# Patient Record
Sex: Male | Born: 1980 | Race: White | Hispanic: No | Marital: Married | Smoking: Former smoker
Health system: Southern US, Community
[De-identification: ages and names within clinical notes are randomized; demographics above are authoritative.]

## PROBLEM LIST (undated history)

## (undated) DIAGNOSIS — T7840XA Allergy, unspecified, initial encounter: Secondary | ICD-10-CM

## (undated) HISTORY — DX: Allergy, unspecified, initial encounter: T78.40XA

---

## 2012-02-05 ENCOUNTER — Ambulatory Visit: Payer: Self-pay | Admitting: Family Medicine

## 2012-02-05 VITALS — BP 108/80 | HR 108 | Temp 98.8°F | Resp 20 | Ht 69.0 in | Wt 188.2 lb

## 2012-02-05 DIAGNOSIS — R6883 Chills (without fever): Secondary | ICD-10-CM

## 2012-02-05 DIAGNOSIS — R509 Fever, unspecified: Secondary | ICD-10-CM

## 2012-02-05 DIAGNOSIS — J111 Influenza due to unidentified influenza virus with other respiratory manifestations: Secondary | ICD-10-CM

## 2012-02-05 DIAGNOSIS — R52 Pain, unspecified: Secondary | ICD-10-CM

## 2012-02-05 DIAGNOSIS — J101 Influenza due to other identified influenza virus with other respiratory manifestations: Secondary | ICD-10-CM

## 2012-02-05 LAB — POCT INFLUENZA A/B: Influenza B, POC: NEGATIVE

## 2012-02-05 NOTE — Progress Notes (Signed)
[address]   [place], [place]  [address]   [phone]  Subjective:    Patient ID: Alexis Hayes, male    DOB: 28-Sep-1980, 31 y.o.   MRN: [id number]  HPIThis 31 y.o. male presents for evaluation of fever, cough.  "I think I have the flu".  Onset two days ago.  +fever Tmax 102.  +myalgias.  +HA.  Mild ear pain L.  +scratchy throat; no pain with swallowing.  No rhinorrhea; mild nasal congestion.  +cough mild; +sputum yellowish tent.  No nausea, vomiting.  +diarrhea x 1.  No SOB.  No rash.  No flu vaccine this season.  Mucinex, Advil, Nyquil.    PCP:  none   Review of Systems  Constitutional: Positive for fever, chills and fatigue. Negative for diaphoresis.  HENT: Positive for ear pain and congestion. Negative for sore throat, rhinorrhea, trouble swallowing, neck pain, neck stiffness and voice change.   Respiratory: Positive for cough. Negative for shortness of breath, wheezing and stridor.   Gastrointestinal: Positive for diarrhea. Negative for nausea and vomiting.  Skin: Negative for rash.  Neurological: Positive for headaches.        Past Medical History  Diagnosis Date  . Allergy     History reviewed. No pertinent past surgical history.  Prior to Admission medications   Medication Sig Start Date End Date Taking? Authorizing Provider  guaiFENesin (MUCINEX) 600 MG 12 hr tablet Take 1,200 mg by mouth 2 (two) times daily.   Yes Historical Provider, MD  ibuprofen (ADVIL,MOTRIN) 200 MG tablet Take 200 mg by mouth every 6 (six) hours as needed.   Yes Historical Provider, MD    No Known Allergies  History   Social History  . Marital Status: Single    Spouse Name: N/A    Number of Children: N/A  . Years of Education: N/A   Occupational History  . Not on file.   Social History Main Topics  . Smoking status: Former Smoker    Quit date: 02/04/2002  . Smokeless tobacco: Not on file  . Alcohol Use: 2.4 oz/week    2 Cans of beer, 2 Shots of liquor per week  . Drug Use: No    . Sexually Active: Not on file   Other Topics Concern  . Not on file   Social History Narrative  . No narrative on file    No family history on file.  Objective:   Physical Exam  Nursing note and vitals reviewed. Constitutional: He is oriented to person, place, and time. He appears well-developed and well-nourished. No distress.       Ill appearing; non-toxic.  HENT:  Head: Normocephalic and atraumatic.  Right Ear: External ear normal.  Left Ear: External ear normal.  Nose: Nose normal.  Mouth/Throat: Oropharynx is clear and moist. No oropharyngeal exudate.  Eyes: Conjunctivae normal are normal. Pupils are equal, round, and reactive to light.  Neck: Normal range of motion.  Cardiovascular: Normal rate, regular rhythm and normal heart sounds.        Rate 100.  Pulmonary/Chest: Effort normal and breath sounds normal. No respiratory distress. He has no wheezes. He has no rales.  Lymphadenopathy:    He has no cervical adenopathy.  Neurological: He is alert and oriented to person, place, and time.  Skin: Skin is warm and dry. No rash noted. He is not diaphoretic.  Psychiatric: He has a normal mood and affect. His behavior is normal. Judgment and thought content normal.    Results  for orders placed in visit on 02/05/12  POCT INFLUENZA A/B      Component Value Range   Influenza A, POC Positive     Influenza B, POC Negative         Assessment & Plan:   1. Fever  POCT Influenza A/B  2. Chills  POCT Influenza A/B  3. Body aches  POCT Influenza A/B  4. Influenza A       1.  Influenza A:  New onset in past 48 hours.  Pt declined therapy/Tamiflu and agree. Continue Ibuprofen but increase to 800mg  tid.  Continue Mucinex and Nyquil.  RTC for development of SOB, acute worsening.  OOW note for three days; to call if needs longer work note.

## 2012-02-05 NOTE — Patient Instructions (Addendum)
1. Fever  POCT Influenza A/B  2. Chills  POCT Influenza A/B  3. Body aches  POCT Influenza A/B  4. Influenza A       1.  IBUPROFEN 200MG   FOUR TABLETS THREE TIMES DAILY WITH FOOD 2.  CONTINUE MUCINEX TWICE DAILY FOR COUGH. 3.  NO WORK WHILE HAVING FEVER 4.  RETURN FOR WORSENING COUGH, SHORTNESS OF BREATH.   Influenza, Adult Influenza ("the flu") is a viral infection of the respiratory tract. It occurs more often in winter months because people spend more time in close contact with one another. Influenza can make you feel very sick. Influenza easily spreads from person to person (contagious). CAUSES  Influenza is caused by a virus that infects the respiratory tract. You can catch the virus by breathing in droplets from an infected person's cough or sneeze. You can also catch the virus by touching something that was recently contaminated with the virus and then touching your mouth, nose, or eyes. SYMPTOMS  Symptoms typically last 4 to 10 days and may include:  Fever.  Chills.  Headache, body aches, and muscle aches.  Sore throat.  Chest discomfort and cough.  Poor appetite.  Weakness or feeling tired.  Dizziness.  Nausea or vomiting. DIAGNOSIS  Diagnosis of influenza is often made based on your history and a physical exam. A nose or throat swab test can be done to confirm the diagnosis. RISKS AND COMPLICATIONS You may be at risk for a more severe case of influenza if you smoke cigarettes, have diabetes, have chronic heart disease (such as heart failure) or lung disease (such as asthma), or if you have a weakened immune system. Elderly people and pregnant women are also at risk for more serious infections. The most common complication of influenza is a lung infection (pneumonia). Sometimes, this complication can require emergency medical care and may be life-threatening. PREVENTION  An annual influenza vaccination (flu shot) is the best way to avoid getting influenza. An annual  flu shot is now routinely recommended for all adults in the [place] TREATMENT  In mild cases, influenza goes away on its own. Treatment is directed at relieving symptoms. For more severe cases, your caregiver may prescribe antiviral medicines to shorten the sickness. Antibiotic medicines are not effective, because the infection is caused by a virus, not by bacteria. HOME CARE INSTRUCTIONS  Only take over-the-counter or prescription medicines for pain, discomfort, or fever as directed by your caregiver.  Use a cool mist humidifier to make breathing easier.  Get plenty of rest until your temperature returns to normal. This usually takes 3 to 4 days.  Drink enough fluids to keep your urine clear or pale yellow.  Cover your mouth and nose when coughing or sneezing, and wash your hands well to avoid spreading the virus.  Stay home from work or school until your fever has been gone for at least 1 full day. SEEK MEDICAL CARE IF:   You have chest pain or a deep cough that worsens or produces more mucus.  You have nausea, vomiting, or diarrhea. SEEK IMMEDIATE MEDICAL CARE IF:   You have difficulty breathing, shortness of breath, or your skin or nails turn bluish.  You have severe neck pain or stiffness.  You have a severe headache, facial pain, or earache.  You have a worsening or recurring fever.  You have nausea or vomiting that cannot be controlled. MAKE SURE YOU:  Understand these instructions.  Will watch your condition.  Will get help right away  if you are not doing well or get worse. Document Released: 03/02/2000 Document Revised: 09/04/2011 Document Reviewed: 06/04/2011 [facility] Patient Information 2013 [place], [place].

## 2012-02-07 ENCOUNTER — Telehealth: Payer: Self-pay

## 2012-02-07 NOTE — Telephone Encounter (Signed)
PT SAW DR. Katrinka Blazing ON Tuesday AND HE WAS DIAGNOSED WITH THE FLU.  SHE GAVE HIM A NOTE THROUGH Wednesday AND TOLD HIM IF HE NEEDED ANOTHER NOTE FOR WORK TO LET HER KNOW.  HE DOES NEED A NOTE FOR TODAY AND POSSIBLY TOMORROW.  WANTS TO KNOW IF WE CAN WRITE TWO, JUST IN CASE HE CANT GO IN TOMORROW.  [phone]

## 2012-02-07 NOTE — Telephone Encounter (Signed)
Patient advised work note ready.

## 2016-12-26 DIAGNOSIS — K122 Cellulitis and abscess of mouth: Secondary | ICD-10-CM | POA: Diagnosis not present

## 2016-12-27 ENCOUNTER — Emergency Department (HOSPITAL_COMMUNITY)
Admission: EM | Admit: 2016-12-27 | Discharge: 2016-12-27 | Disposition: A | Discharge status: Home / Self Care | Payer: BLUE CROSS/BLUE SHIELD | Attending: Emergency Medicine | Admitting: Emergency Medicine

## 2016-12-27 ENCOUNTER — Encounter (HOSPITAL_COMMUNITY): Payer: Self-pay

## 2016-12-27 ENCOUNTER — Emergency Department (HOSPITAL_COMMUNITY): Payer: BLUE CROSS/BLUE SHIELD

## 2016-12-27 DIAGNOSIS — R0789 Other chest pain: Secondary | ICD-10-CM | POA: Diagnosis not present

## 2016-12-27 DIAGNOSIS — Z87891 Personal history of nicotine dependence: Secondary | ICD-10-CM | POA: Insufficient documentation

## 2016-12-27 DIAGNOSIS — Z8249 Family history of ischemic heart disease and other diseases of the circulatory system: Secondary | ICD-10-CM | POA: Insufficient documentation

## 2016-12-27 DIAGNOSIS — R079 Chest pain, unspecified: Secondary | ICD-10-CM | POA: Diagnosis not present

## 2016-12-27 LAB — HEPATIC FUNCTION PANEL
ALBUMIN: 4.6 g/dL (ref 3.5–5.0)
ALK PHOS: 81 U/L (ref 38–126)
ALT: 50 U/L (ref 17–63)
AST: 27 U/L (ref 15–41)
Bilirubin, Direct: 0.1 mg/dL (ref 0.1–0.5)
Indirect Bilirubin: 0.7 mg/dL (ref 0.3–0.9)
TOTAL PROTEIN: 7.8 g/dL (ref 6.5–8.1)
Total Bilirubin: 0.8 mg/dL (ref 0.3–1.2)

## 2016-12-27 LAB — CBC
HEMATOCRIT: 44 % (ref 39.0–52.0)
HEMOGLOBIN: 15.6 g/dL (ref 13.0–17.0)
MCH: 29.8 pg (ref 26.0–34.0)
MCHC: 35.5 g/dL (ref 30.0–36.0)
MCV: 84 fL (ref 78.0–100.0)
Platelets: 298 10*3/uL (ref 150–400)
RBC: 5.24 MIL/uL (ref 4.22–5.81)
RDW: 12.4 % (ref 11.5–15.5)
WBC: 9.7 10*3/uL (ref 4.0–10.5)

## 2016-12-27 LAB — BASIC METABOLIC PANEL
ANION GAP: 10 (ref 5–15)
BUN: 12 mg/dL (ref 6–20)
CHLORIDE: 102 mmol/L (ref 101–111)
CO2: 26 mmol/L (ref 22–32)
Calcium: 9.8 mg/dL (ref 8.9–10.3)
Creatinine, Ser: 1.14 mg/dL (ref 0.61–1.24)
GFR calc Af Amer: >60 mL/min (ref >60)
GFR calc non Af Amer: >60 mL/min (ref >60)
GLUCOSE: 117 mg/dL — AB (ref 65–99)
POTASSIUM: 4.3 mmol/L (ref 3.5–5.1)
Sodium: 138 mmol/L (ref 135–145)

## 2016-12-27 LAB — I-STAT TROPONIN, ED
TROPONIN I, POC: 0 ng/mL (ref 0.00–0.08)
Troponin i, poc: 0 ng/mL (ref 0.00–0.08)

## 2016-12-27 LAB — LIPASE, BLOOD: LIPASE: 28 U/L (ref 11–51)

## 2016-12-27 LAB — D-DIMER, QUANTITATIVE (NOT AT ARMC)

## 2016-12-27 MED ORDER — SODIUM CHLORIDE 0.9 % IV BOLUS (SEPSIS)
1000.0000 mL | Freq: Once | INTRAVENOUS | Status: AC
Start: 2016-12-27 — End: 2016-12-27
  Administered 2016-12-27: 1000 mL via INTRAVENOUS

## 2016-12-27 NOTE — Discharge Instructions (Signed)
Take ASA 81 mg daily.   See your doctor. Consider stress test if you have persistent pain.   Return to ER if you have worse chest pain, shortness of breath.

## 2016-12-27 NOTE — ED Provider Notes (Signed)
[facility]-EMERGENCY DEPT Provider Note   CSN: [id number] Arrival date & time: 12/27/16  1427     History   Chief Complaint Chief Complaint  Patient presents with  . Chest Pain    HPI Alexis Hayes is a 36 y.o. male otherwise healthy here presenting with chest pain. Patient has left-sided chest pain intermittently over the last several weeks. States that it comes and goes and occasionally radiate down the left arm. Has occasional shortness of breath denies any leg swelling or history of blood clots. Patient states that he has significant family history of heart attacks and one of his uncle died in his 36s from MI but he has no personal history of CAD.    The history is provided by the patient.    Past Medical History:  Diagnosis Date  . Allergy     There are no active problems to display for this patient.   History reviewed. No pertinent surgical history.     Home Medications    Prior to Admission medications   Medication Sig Start Date End Date Taking? Authorizing Provider  guaiFENesin (MUCINEX) 600 MG 12 hr tablet Take 1,200 mg by mouth 2 (two) times daily.    [provider]  ibuprofen (ADVIL,MOTRIN) 200 MG tablet Take 200 mg by mouth every 6 (six) hours as needed.    [provider]    Family History No family history on file.  Social History Social History  Substance Use Topics  . Smoking status: Former Smoker    Quit date: 02/04/2002  . Smokeless tobacco: Never Used  . Alcohol use 2.4 oz/week    2 Cans of beer, 2 Shots of liquor per week     Allergies   Patient has no known allergies.   Review of Systems Review of Systems  Cardiovascular: Positive for chest pain.  All other systems reviewed and are negative.    Physical Exam Updated Vital Signs BP 126/83   Pulse 79   Temp 98 F (36.7 C) (Oral)   Resp 10   SpO2 99%   Physical Exam  Constitutional: He is oriented to person, place, and time. He appears well-developed.    HENT:  Head: Normocephalic.  Mouth/Throat: Oropharynx is clear and moist.  Eyes: Pupils are equal, round, and reactive to light. Conjunctivae and EOM are normal.  Neck: Normal range of motion. Neck supple.  Cardiovascular: Normal rate, regular rhythm and normal heart sounds.   Pulmonary/Chest: Effort normal and breath sounds normal. No respiratory distress. He has no wheezes. He has no rales.  Abdominal: Soft. Bowel sounds are normal.  Mild epigastric and LUQ tenderness, no RUQ tenderness, no CVAT   Musculoskeletal: Normal range of motion.  Neurological: He is alert and oriented to person, place, and time. No cranial nerve deficit. Coordination normal.  Skin: Skin is warm.  Psychiatric: He has a normal mood and affect.  Nursing note and vitals reviewed.    ED Treatments / Results  Labs (all labs ordered are listed, but only abnormal results are displayed) Labs Reviewed  BASIC METABOLIC PANEL - Abnormal; Notable for the following:       Result Value   Glucose, Bld 117 (*)    All other components within normal limits  CBC  LIPASE, BLOOD  HEPATIC FUNCTION PANEL  D-DIMER, QUANTITATIVE (NOT AT [facility])  I-STAT TROPONIN, ED  I-STAT TROPONIN, ED    EKG  EKG Interpretation  Date/Time:  Thursday December 27 2016 14:32:26 EDT Ventricular Rate:  110 PR  Interval:  140 QRS Duration: 82 QT Interval:  350 QTC Calculation: 473 R Axis:   74 Text Interpretation:  Sinus tachycardia Otherwise normal ECG No previous ECGs available Confirmed by Richardean Canal [id number]) on 12/27/2016 4:55:04 PM       Radiology Dg Chest 2 View  Result Date: 12/27/2016 CLINICAL DATA:  Central chest pain with epigastric pain and shortness-of-breath as well as left arm weakness and intermittent fever 1-2 weeks. EXAM: CHEST  2 VIEW COMPARISON:  None. FINDINGS: Lungs are adequately inflated and otherwise clear. Cardiomediastinal silhouette is within normal. Bones and soft tissues are normal. IMPRESSION: No active  cardiopulmonary disease. Electronically Signed   By: Elberta Fortis M.D.   On: 12/27/2016 15:00    Procedures Procedures (including critical care time)  Medications Ordered in ED Medications  sodium chloride 0.9 % bolus 1,000 mL (0 mLs Intravenous Stopped 12/27/16 1954)     Initial Impression / Assessment and Plan / ED Course  I have reviewed the triage vital signs and the nursing notes.  Pertinent labs & imaging results that were available during my care of the patient were reviewed by me and considered in my medical decision making (see chart for details).     Alexis Hayes is a 36 y.o. male here with chest pain. Has family hx of CAD but no personal hx. Was tachycardic in triage. Consider PE vs ACS. Will get labs, d-dimer, trop x 2, CXR.   7:57 PM D-dimer neg. Trop neg x 2. CXR nl. Pain free. Will dc home with [facility]. Recommend stress test outpatient if he has persistent pain.    Final Clinical Impressions(s) / ED Diagnoses   Final diagnoses:  None    New Prescriptions New Prescriptions   No medications on file     Charlynne Pander, MD 12/27/16 [phone]

## 2016-12-27 NOTE — ED Notes (Signed)
Patient given discharge instructions and verbalized understanding.  Patient stable to discharge at this time.  Patient is alert and oriented to baseline.  No distressed noted at this time.  All belongings taken with the patient at discharge.   

## 2016-12-27 NOTE — ED Triage Notes (Signed)
Pt presents for central chest pain with radiation to R and L upper abd and radiation down L arm x 3 weeks. Pt reports it is intermittent, denies n/v/lightheadedness/sob.

## 2016-12-27 NOTE — ED Notes (Signed)
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

## 2017-03-15 DIAGNOSIS — Z0000 Encounter for general adult medical examination without abnormal findings: Secondary | ICD-10-CM | POA: Diagnosis not present

## 2017-03-15 DIAGNOSIS — Z23 Encounter for immunization: Secondary | ICD-10-CM | POA: Diagnosis not present

## 2017-03-15 DIAGNOSIS — Z13220 Encounter for screening for lipoid disorders: Secondary | ICD-10-CM | POA: Diagnosis not present

## 2017-04-12 DIAGNOSIS — N5089 Other specified disorders of the male genital organs: Secondary | ICD-10-CM | POA: Diagnosis not present

## 2017-05-23 DIAGNOSIS — J069 Acute upper respiratory infection, unspecified: Secondary | ICD-10-CM | POA: Diagnosis not present

## 2017-05-23 DIAGNOSIS — R739 Hyperglycemia, unspecified: Secondary | ICD-10-CM | POA: Diagnosis not present

## 2017-05-24 ENCOUNTER — Telehealth: Payer: Self-pay | Admitting: *Deleted

## 2017-05-24 NOTE — Telephone Encounter (Signed)
[facility] [phone].

## 2017-05-26 ENCOUNTER — Encounter (HOSPITAL_COMMUNITY): Payer: Self-pay | Admitting: Emergency Medicine

## 2017-05-26 ENCOUNTER — Emergency Department (HOSPITAL_COMMUNITY)
Admission: EM | Admit: 2017-05-26 | Discharge: 2017-05-26 | Disposition: A | Discharge status: Home / Self Care | Payer: BLUE CROSS/BLUE SHIELD | Attending: Emergency Medicine | Admitting: Emergency Medicine

## 2017-05-26 ENCOUNTER — Emergency Department (HOSPITAL_COMMUNITY): Payer: BLUE CROSS/BLUE SHIELD

## 2017-05-26 DIAGNOSIS — R0789 Other chest pain: Secondary | ICD-10-CM | POA: Insufficient documentation

## 2017-05-26 DIAGNOSIS — R079 Chest pain, unspecified: Secondary | ICD-10-CM | POA: Diagnosis not present

## 2017-05-26 DIAGNOSIS — Z87891 Personal history of nicotine dependence: Secondary | ICD-10-CM | POA: Insufficient documentation

## 2017-05-26 LAB — CBC
HCT: 45.1 % (ref 39.0–52.0)
HEMOGLOBIN: 16 g/dL (ref 13.0–17.0)
MCH: 30.1 pg (ref 26.0–34.0)
MCHC: 35.5 g/dL (ref 30.0–36.0)
MCV: 84.9 fL (ref 78.0–100.0)
Platelets: 331 10*3/uL (ref 150–400)
RBC: 5.31 MIL/uL (ref 4.22–5.81)
RDW: 12.5 % (ref 11.5–15.5)
WBC: 7.2 10*3/uL (ref 4.0–10.5)

## 2017-05-26 LAB — BASIC METABOLIC PANEL
ANION GAP: 13 (ref 5–15)
BUN: 10 mg/dL (ref 6–20)
CALCIUM: 9.4 mg/dL (ref 8.9–10.3)
CHLORIDE: 103 mmol/L (ref 101–111)
CO2: 22 mmol/L (ref 22–32)
Creatinine, Ser: 0.91 mg/dL (ref 0.61–1.24)
GFR calc Af Amer: >60 mL/min (ref >60)
GFR calc non Af Amer: >60 mL/min (ref >60)
GLUCOSE: 131 mg/dL — AB (ref 65–99)
Potassium: 4.1 mmol/L (ref 3.5–5.1)
Sodium: 138 mmol/L (ref 135–145)

## 2017-05-26 LAB — I-STAT TROPONIN, ED
TROPONIN I, POC: 0 ng/mL (ref 0.00–0.08)
Troponin i, poc: 0 ng/mL (ref 0.00–0.08)

## 2017-05-26 LAB — D-DIMER, QUANTITATIVE: D-Dimer, Quant: <0.27 ug/mL-FEU (ref 0.00–0.50)

## 2017-05-26 MED ORDER — ASPIRIN EC 81 MG PO TBEC: 81.0000 mg | DELAYED_RELEASE_TABLET | Freq: Every day | ORAL | 0 refills | Status: AC

## 2017-05-26 NOTE — ED Provider Notes (Signed)
[facility]   CSN: [id number] Arrival date & time: 05/26/17  1214     History   Chief Complaint Chief Complaint  Patient presents with  . Chest Pain    HPI Alexis Hayes is a 37 y.o. male.  HPI   Alexis Hayes is a 37 y.o. male, with a history of seasonal allergies, presenting to the ED with chest pain intermittent over the last 5 days.  Pain is sharp, central to left chest, mild, nonradiating.  Pain tends to last for a few seconds, never more than a minute.  He has noticed it while walking, but states, "By the time I notice it, it's gone." Patient also complains of left posterior thigh pain intermittent for the last 3 weeks.  Describes the pain as a soreness, mild to moderate, arises with running, sometimes walking, nonradiating. Patient is physically active, exercising and running 3-4 days/week.  Neither of the patient's complaints have inhibited his physical activity. Patient saw his PCP on March 8.  States she will be setting up a cardiology appointment for him.  Denies shortness of breath, diaphoresis, dizziness, nausea/vomiting, cough, fever/chills, abdominal pain, trauma, neurologic deficits, or any other complaints.  Denies history of PE/DVT, hormone therapy, recent travel or immobilization, recent trauma, recent surgery.   Past Medical History:  Diagnosis Date  . Allergy     There are no active problems to display for this patient.   No past surgical history on file.     Home Medications    Prior to Admission medications   Medication Sig Start Date End Date Taking? Authorizing Provider  aspirin EC 81 MG tablet Take 1 tablet (81 mg total) by mouth daily. 05/26/17   Joy, Shawn C, PA-C  guaiFENesin (MUCINEX) 600 MG 12 hr tablet Take 1,200 mg by mouth 2 (two) times daily.    [provider]  ibuprofen (ADVIL,MOTRIN) 200 MG tablet Take 200 mg by mouth every 6 (six) hours as needed.    [provider]    Family History Family History  Problem Relation Age of Onset  . Heart attack Father 7766  . Diabetes Father 3063  . Diabetes Paternal Uncle   . Heart attack Paternal Uncle 41  . Heart attack Paternal Uncle 6068    Social History Social History   Tobacco Use  . Smoking status: Former Smoker    Last attempt to quit: 02/04/2002    Years since quitting: 15.3  . Smokeless tobacco: Never Used  Substance Use Topics  . Alcohol use: Yes    Alcohol/week: 2.4 oz    Types: 2 Cans of beer, 2 Shots of liquor per week  . Drug use: No     Allergies   Patient has no known allergies.   Review of Systems Review of Systems  Constitutional: Negative for chills, diaphoresis and fever.  Respiratory: Negative for cough and shortness of breath.   Cardiovascular: Positive for chest pain. Negative for palpitations and leg swelling.  Gastrointestinal: Negative for abdominal pain, diarrhea, nausea and vomiting.  Musculoskeletal: Positive for myalgias. Negative for back pain.  Neurological: Negative for dizziness, syncope, weakness, light-headedness and numbness.  All other systems reviewed and are negative.    Physical Exam Updated Vital Signs BP 126/81   Pulse 73   Temp 98.6 F (37 C) (Oral)   Resp 18   SpO2 98%   Physical Exam  Constitutional: He appears well-developed and well-nourished. No distress.  HENT:  Head: Normocephalic  and atraumatic.  Eyes: Conjunctivae are normal.  Neck: Neck supple.  Cardiovascular: Normal rate, regular rhythm, normal heart sounds and intact distal pulses.  Pulmonary/Chest: Effort normal and breath sounds normal. No respiratory distress.  Abdominal: Soft. There is no tenderness. There is no guarding.  Musculoskeletal: He exhibits no edema.  No tenderness to the patient's left lower extremity.  No erythema, increased warmth, or swelling.  Pain elicited in that central hamstring with patient's left straight leg raise. No tenderness or  other abnormality to the calf  Lymphadenopathy:    He has no cervical adenopathy.  Neurological: He is alert.  Skin: Skin is warm and dry. He is not diaphoretic.  Psychiatric: He has a normal mood and affect. His behavior is normal.  Nursing note and vitals reviewed.    ED Treatments / Results  Labs (all labs ordered are listed, but only abnormal results are displayed) Labs Reviewed  BASIC METABOLIC PANEL - Abnormal; Notable for the following components:      Result Value   Glucose, Bld 131 (*)    All other components within normal limits  CBC  D-DIMER, QUANTITATIVE (NOT AT [facility])  I-STAT TROPONIN, ED  I-STAT TROPONIN, ED    EKG  EKG Interpretation  Date/Time:  Sunday May 26 2017 12:16:43 EDT Ventricular Rate:  89 PR Interval:  142 QRS Duration: 74 QT Interval:  360 QTC Calculation: 438 R Axis:   53 Text Interpretation:  Normal sinus rhythm Normal ECG No significant change was found Confirmed by Azalia Bilis ([id number]) on 05/26/2017 5:11:29 PM       Radiology Dg Chest 2 View  Result Date: 05/26/2017 CLINICAL DATA:  Chest pain EXAM: CHEST - 2 VIEW COMPARISON:  12/27/2016 FINDINGS: Heart and mediastinal contours are within normal limits. No focal opacities or effusions. No acute bony abnormality. IMPRESSION: No active cardiopulmonary disease. Electronically Signed   By: Charlett Nose M.D.   On: 05/26/2017 12:56    Procedures Procedures (including critical care time)  Medications Ordered in ED Medications - No data to display   Initial Impression / Assessment and Plan / ED Course  I have reviewed the triage vital signs and the nursing notes.  Pertinent labs & imaging results that were available during my care of the patient were reviewed by me and considered in my medical decision making (see chart for details).  Clinical Course as of May 27 1714  Wynelle Link May 26, 2017  1636 Discussed second troponin and d-dimer results with the patient.  Patient continues to be pain  and symptom-free.  [SJ]    Clinical Course User Index [SJ] Joy, Shawn C, PA-C    Patient presents with complaint of episodes of chest pain that sound atypical.  HEART score is 1, indicating low risk for a cardiac event. Wells criteria score is 0, indicating low risk for PE.  My suspicion for DVT in the left lower extremity is low.  Discussed duplex ultrasound for the patient's left lower extremity pain.  Patient declined this study at this time.  Delta troponins negative.  D-dimer negative.  Close cardiology follow-up. The patient was given instructions for home care as well as return precautions. Patient voices understanding of these instructions, accepts the plan, and is comfortable with discharge.  Vitals:   05/26/17 1222 05/26/17 1419 05/26/17 1420  BP: 120/80 126/81   Pulse: 85  73  Resp: 18    Temp: 98.6 F (37 C)    TempSrc: Oral    SpO2: 98%  98%   Vitals:   05/26/17 1420 05/26/17 1430 05/26/17 1515 05/26/17 1600  BP:  118/82 119/76 116/73  Pulse: 73 70 74 66  Resp:      Temp:      TempSrc:      SpO2: 98% 98% 97% 97%     Final Clinical Impressions(s) / ED Diagnoses   Final diagnoses:  Atypical chest pain    ED Discharge Orders        Ordered    aspirin EC 81 MG tablet  Daily     05/26/17 1627       Concepcion Living 05/26/17 1718    Azalia Bilis, MD 05/27/17 1206

## 2017-05-26 NOTE — ED Triage Notes (Signed)
Pt states 2-3 days of a sharp centralized chest pain that comes and goes about 5 times per day. No pain currently. 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 Pt concerned because heart disease runs in his family. Pt in process of being set up with cardiology after a visit in December to ER.

## 2017-05-26 NOTE — Discharge Instructions (Addendum)
There were no abnormalities noted with the cardiac enzymes.  Other lab results were also encouraging.  The d-dimer was negative.  Should your leg pain worsen, it is recommended that you undergo an ultrasound of the leg to check for blood clots and other abnormalities. Follow-up with the cardiologist as soon as possible on this matter. Please begin taking 81 mg of aspirin daily.  The cardiologist may discontinue this at their discretion. Return to the ED for worsening symptoms.

## 2017-05-29 ENCOUNTER — Telehealth: Payer: Self-pay

## 2017-05-29 NOTE — Telephone Encounter (Signed)
ERROR

## 2017-12-27 DIAGNOSIS — N451 Epididymitis: Secondary | ICD-10-CM | POA: Diagnosis not present

## 2017-12-30 DIAGNOSIS — Z23 Encounter for immunization: Secondary | ICD-10-CM | POA: Diagnosis not present

## 2017-12-30 DIAGNOSIS — N5089 Other specified disorders of the male genital organs: Secondary | ICD-10-CM | POA: Diagnosis not present

## 2018-01-01 ENCOUNTER — Other Ambulatory Visit: Payer: Self-pay | Admitting: Family Medicine

## 2018-01-01 DIAGNOSIS — N5089 Other specified disorders of the male genital organs: Secondary | ICD-10-CM

## 2018-01-06 ENCOUNTER — Ambulatory Visit
Admission: RE | Admit: 2018-01-06 | Discharge: 2018-01-06 | Disposition: A | Discharge status: Home / Self Care | Payer: BLUE CROSS/BLUE SHIELD | Source: Ambulatory Visit | Attending: Family Medicine | Admitting: Family Medicine

## 2018-01-06 DIAGNOSIS — N5089 Other specified disorders of the male genital organs: Secondary | ICD-10-CM

## 2018-01-06 DIAGNOSIS — Z0389 Encounter for observation for other suspected diseases and conditions ruled out: Secondary | ICD-10-CM | POA: Diagnosis not present

## 2018-01-14 ENCOUNTER — Other Ambulatory Visit: Payer: BLUE CROSS/BLUE SHIELD

## 2018-04-17 DIAGNOSIS — Z136 Encounter for screening for cardiovascular disorders: Secondary | ICD-10-CM | POA: Diagnosis not present

## 2018-04-17 DIAGNOSIS — R109 Unspecified abdominal pain: Secondary | ICD-10-CM | POA: Diagnosis not present

## 2018-04-17 DIAGNOSIS — R739 Hyperglycemia, unspecified: Secondary | ICD-10-CM | POA: Diagnosis not present

## 2018-04-17 DIAGNOSIS — Z0000 Encounter for general adult medical examination without abnormal findings: Secondary | ICD-10-CM | POA: Diagnosis not present

## 2018-05-06 IMAGING — CR DG CHEST 2V
2 series · 2 of 2 positions shown · non-contrast
Comparison: None.

CLINICAL DATA: Central chest pain with epigastric pain and
shortness-of-breath as well as left arm weakness and intermittent
fever 1-2 weeks.

EXAM:
CHEST  2 VIEW

[chest lat]
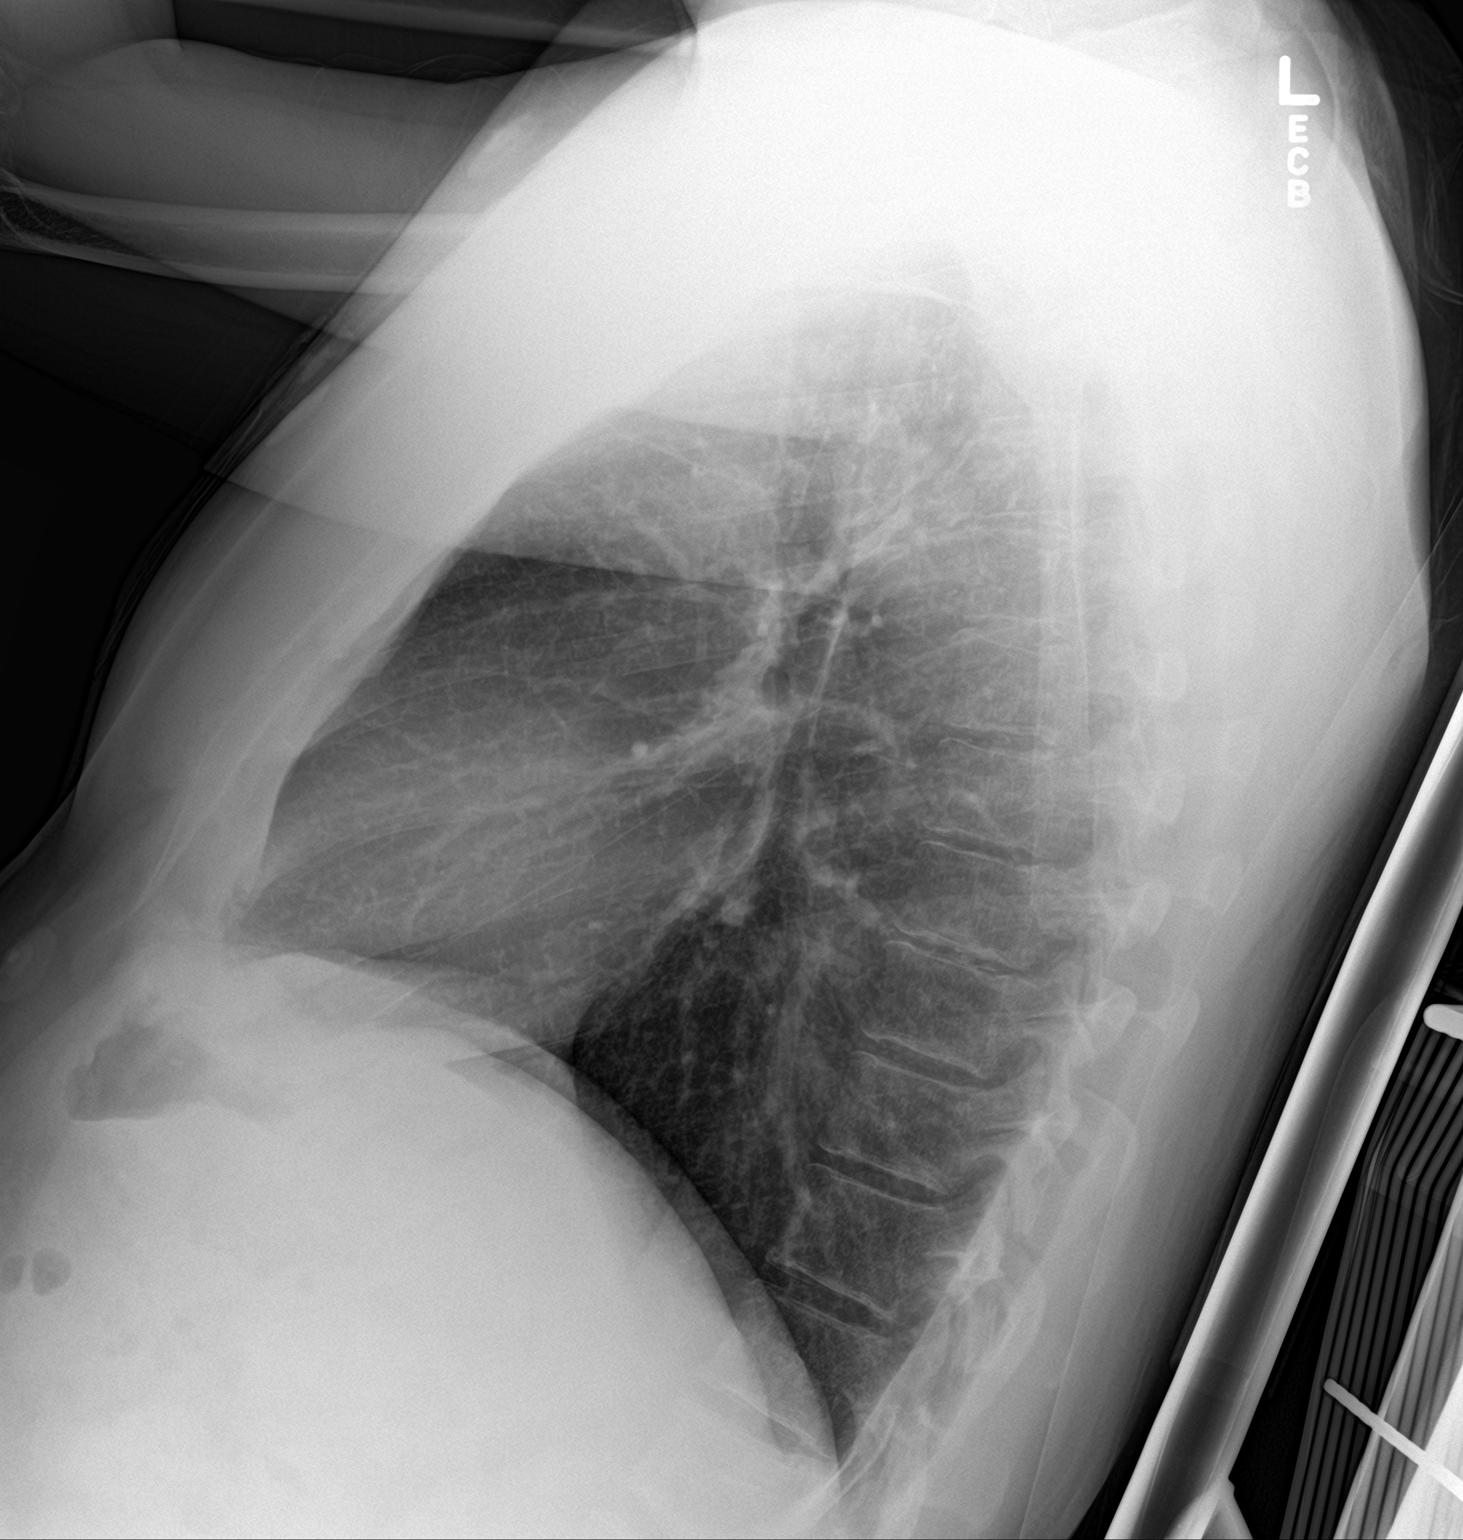

[chest ap]
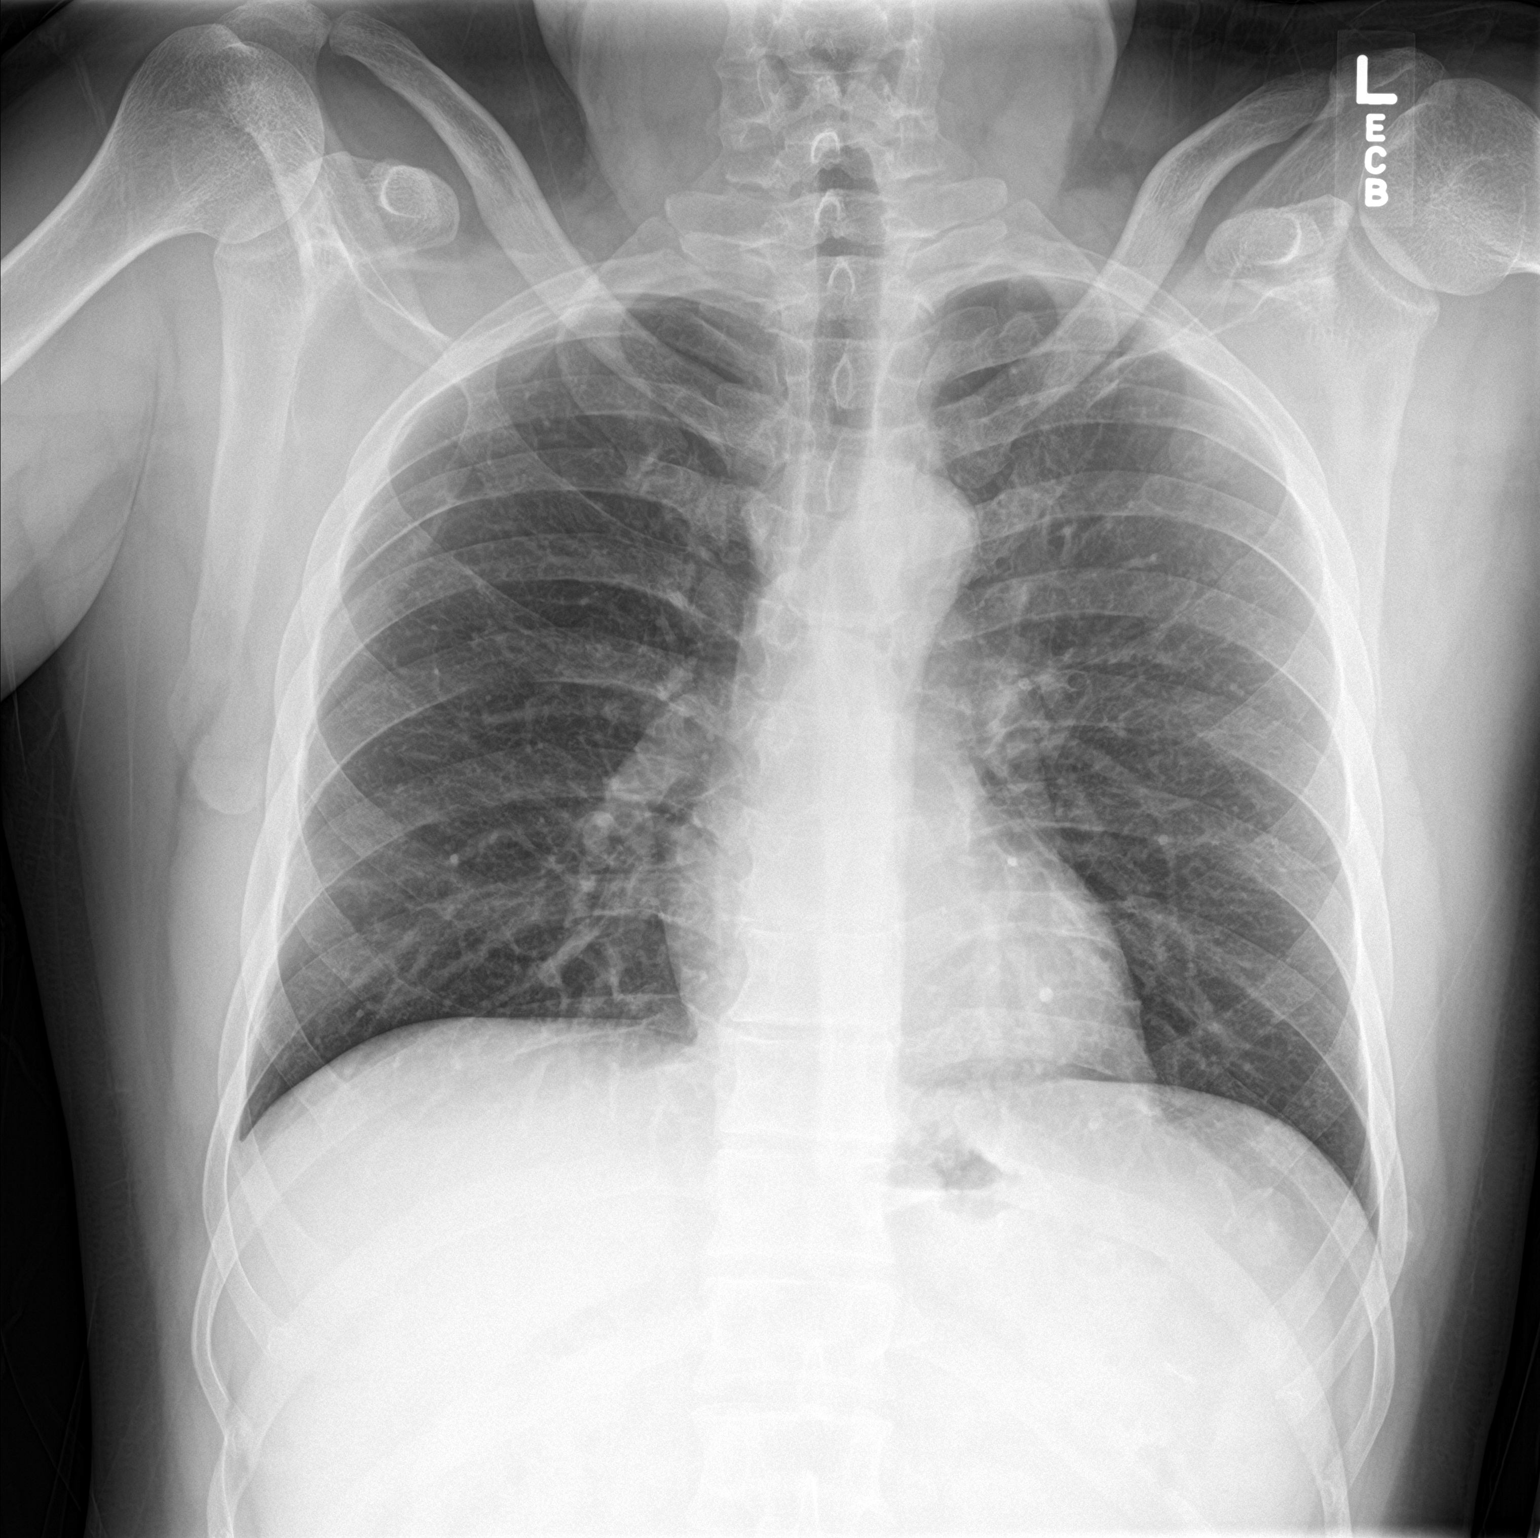

[2 of 2 positions shown; findings below may reference images not displayed]

FINDINGS: Lungs are adequately inflated and otherwise clear. Cardiomediastinal
silhouette is within normal. Bones and soft tissues are normal.
IMPRESSION: No active cardiopulmonary disease.

## 2018-10-16 DIAGNOSIS — D485 Neoplasm of uncertain behavior of skin: Secondary | ICD-10-CM | POA: Diagnosis not present

## 2018-10-16 DIAGNOSIS — L723 Sebaceous cyst: Secondary | ICD-10-CM | POA: Diagnosis not present

## 2018-10-16 DIAGNOSIS — D225 Melanocytic nevi of trunk: Secondary | ICD-10-CM | POA: Diagnosis not present

## 2018-10-23 IMAGING — US US SCROTUM
1 series · 14 of 25 positions shown · non-contrast
Comparison: None.

CLINICAL DATA: Possible epididymal cyst

EXAM:
ULTRASOUND OF SCROTUM
TECHNIQUE: Complete ultrasound examination of the testicles, epididymis, and
other scrotal structures was performed.

[Series 1: us scrotum · 0.07mm/px · 14 of 38 slices shown]
[im 1/38]
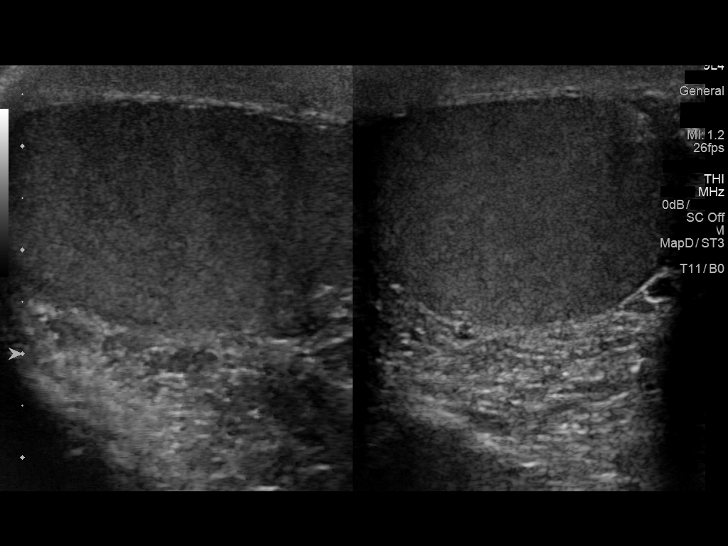
[im 4/38]
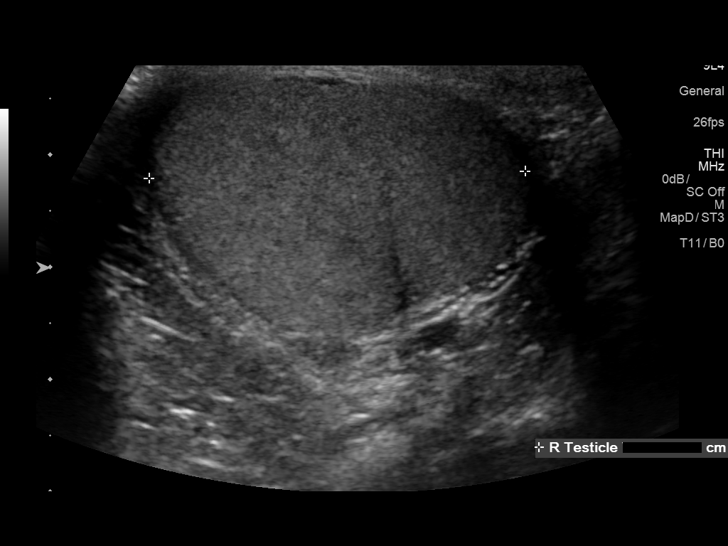
[im 7/38]
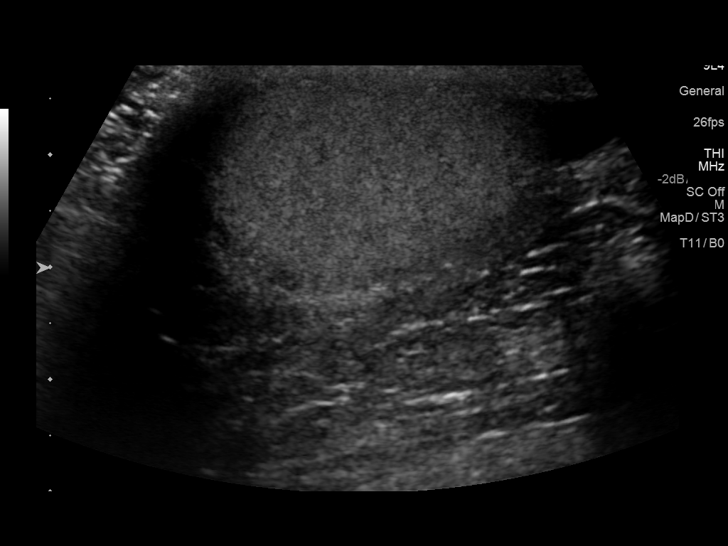
[im 10/38]
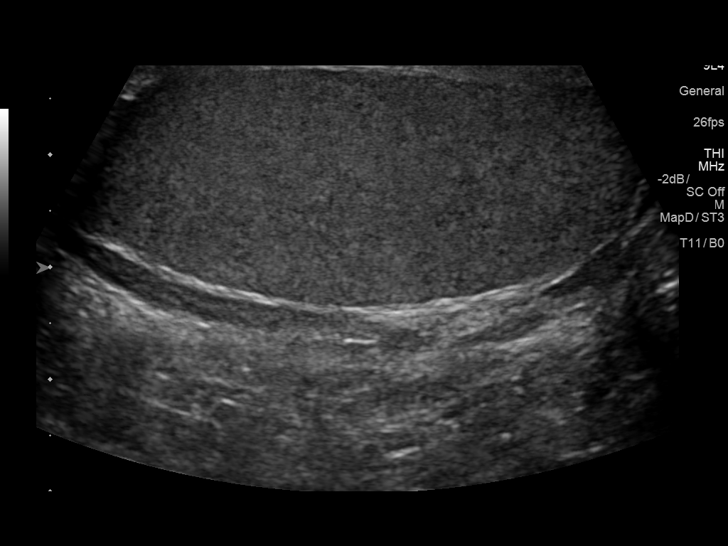
[im 13/38]
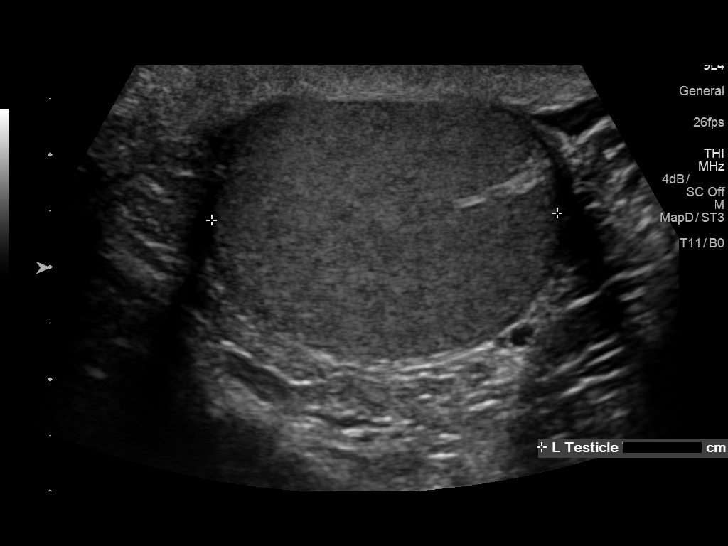
[im 14/38]
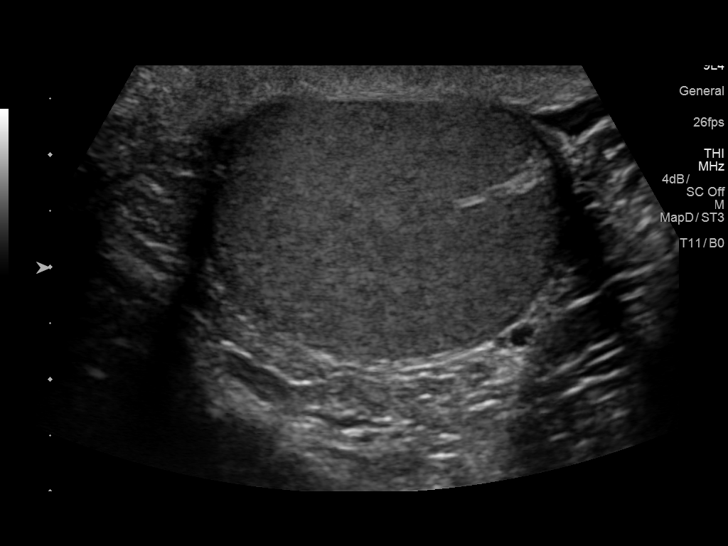
[im 17/38]
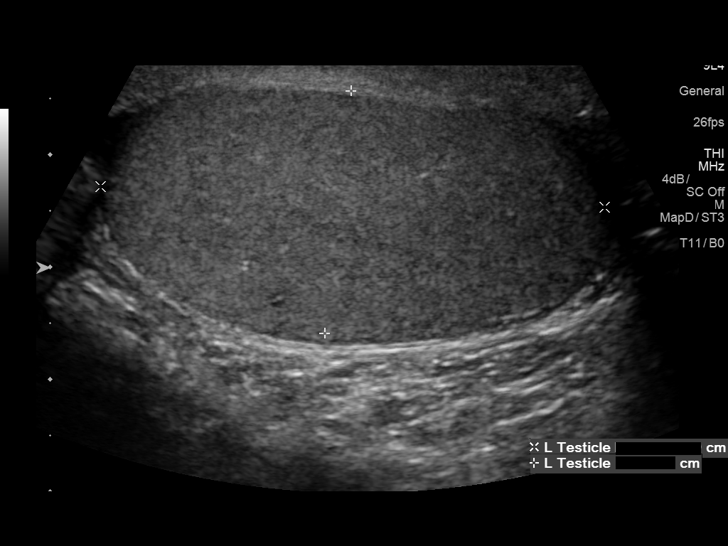
[im 21/38]
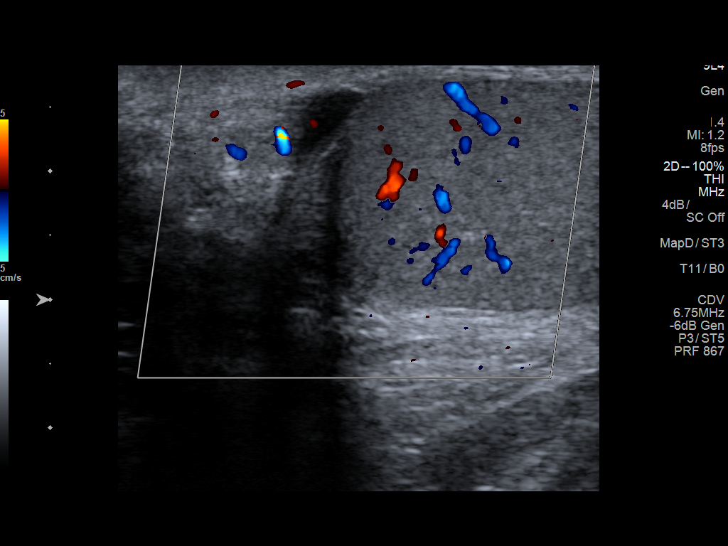
[im 24/38]
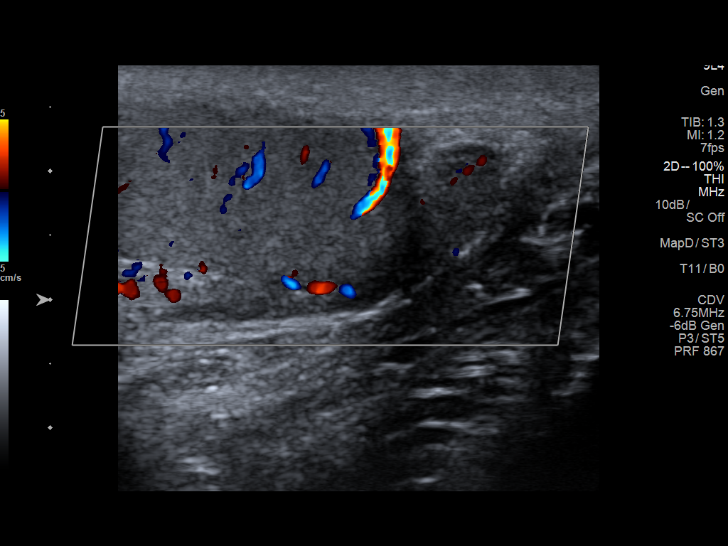
[im 25/38]
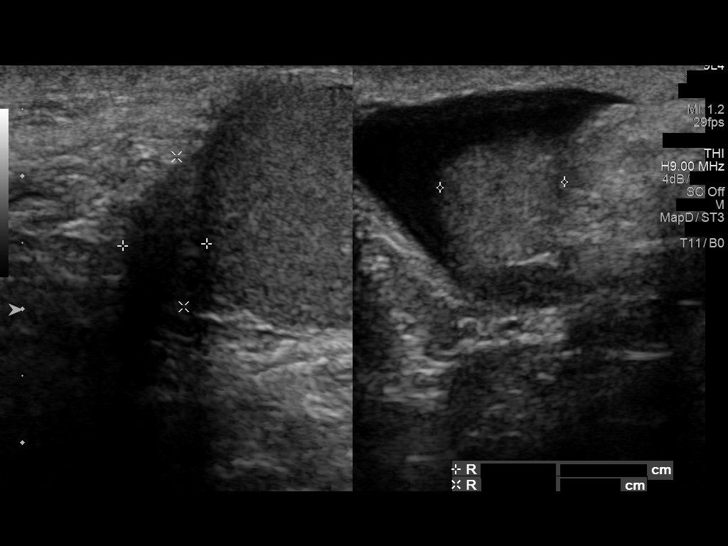
[im 28/38]
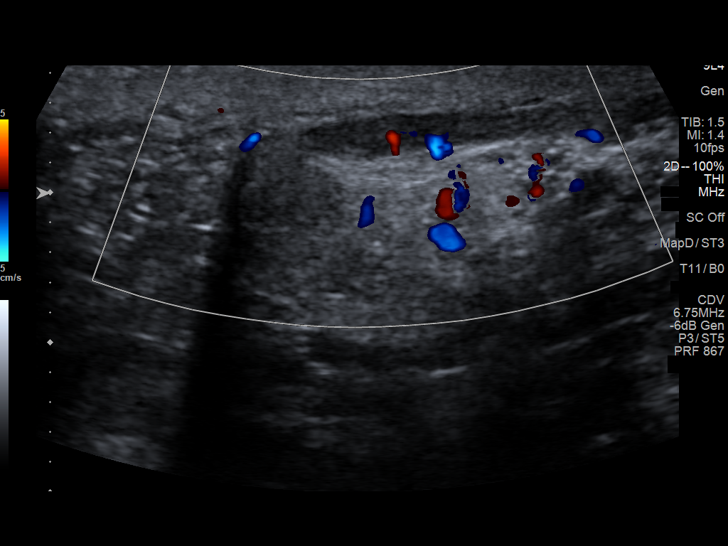
[im 31/38]
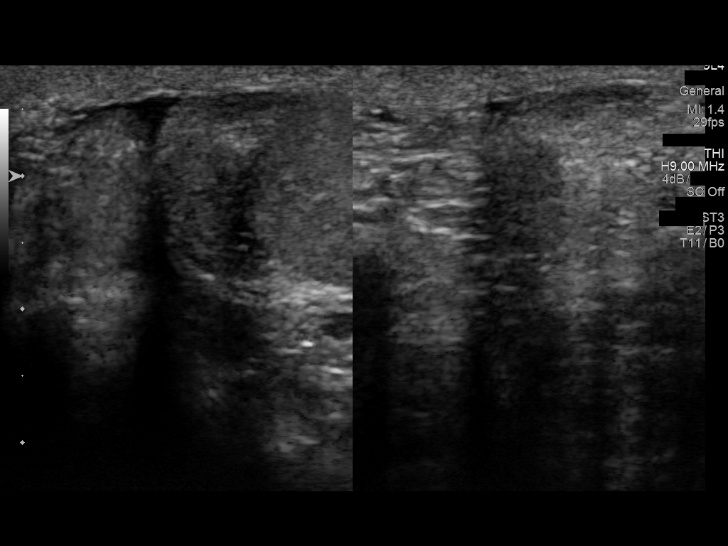
[im 34/38]
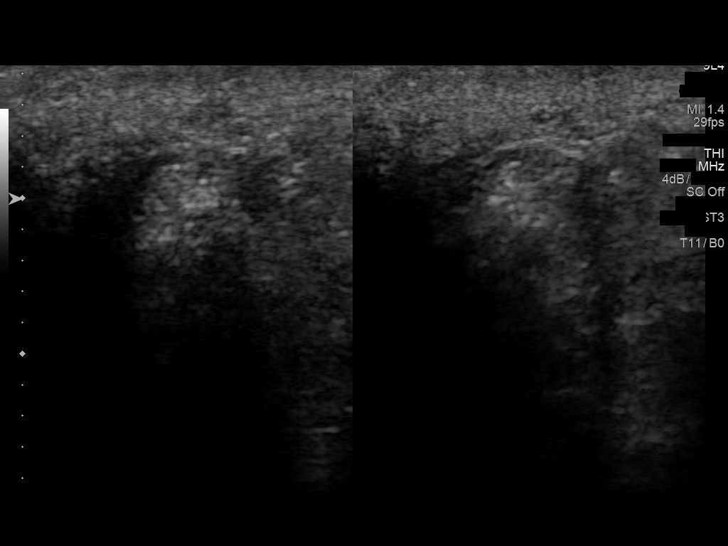
[im 38/38]
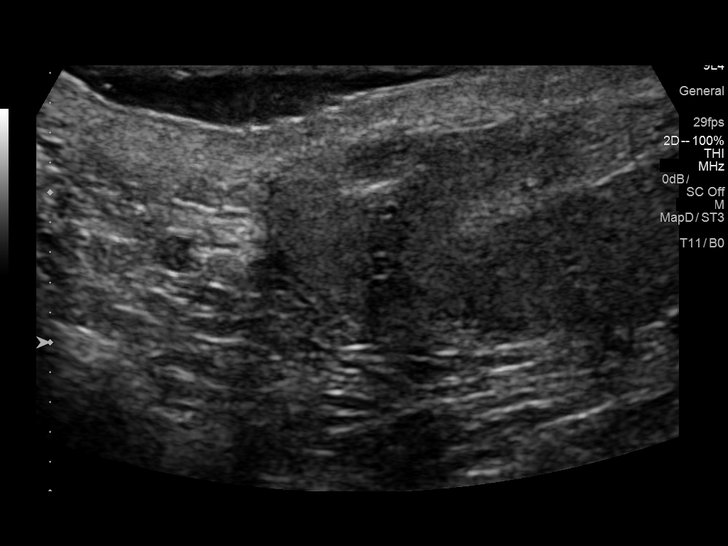

[14 of 25 positions shown; findings below may reference images not displayed]

FINDINGS: Right testicle

Measurements: 5.0 x 2.1 x 3.4 cm.. No mass or microlithiasis
visualized.

Left testicle

Measurements: 4.5 x 2.1 x 3.1 cm.. No mass or microlithiasis
visualized. The palpable scrotal abnormality corresponds to the head
of the left epididymis. No focal mass is seen.

Right epididymis:  Normal in size and appearance.

Left epididymis:  Normal in size and appearance.

Hydrocele:  None visualized.

Varicocele:  None visualized.
IMPRESSION: No acute abnormality noted. The patient's palpable abnormality
corresponds to the head of the left epididymis. No mass is seen.

## 2018-11-19 DIAGNOSIS — Z20828 Contact with and (suspected) exposure to other viral communicable diseases: Secondary | ICD-10-CM | POA: Diagnosis not present

## 2018-12-24 DIAGNOSIS — M79672 Pain in left foot: Secondary | ICD-10-CM | POA: Diagnosis not present

## 2019-06-18 DIAGNOSIS — Z0000 Encounter for general adult medical examination without abnormal findings: Secondary | ICD-10-CM | POA: Diagnosis not present

## 2019-06-18 DIAGNOSIS — R739 Hyperglycemia, unspecified: Secondary | ICD-10-CM | POA: Diagnosis not present

## 2019-06-18 DIAGNOSIS — Z13220 Encounter for screening for lipoid disorders: Secondary | ICD-10-CM | POA: Diagnosis not present

## 2019-08-01 DIAGNOSIS — Z20828 Contact with and (suspected) exposure to other viral communicable diseases: Secondary | ICD-10-CM | POA: Diagnosis not present

## 2019-08-01 DIAGNOSIS — Z20822 Contact with and (suspected) exposure to covid-19: Secondary | ICD-10-CM | POA: Diagnosis not present

## 2019-08-06 DIAGNOSIS — U071 COVID-19: Secondary | ICD-10-CM | POA: Diagnosis not present

## 2019-08-06 DIAGNOSIS — Z20828 Contact with and (suspected) exposure to other viral communicable diseases: Secondary | ICD-10-CM | POA: Diagnosis not present

## 2019-08-09 DIAGNOSIS — Z03818 Encounter for observation for suspected exposure to other biological agents ruled out: Secondary | ICD-10-CM | POA: Diagnosis not present

## 2019-08-09 DIAGNOSIS — Z20828 Contact with and (suspected) exposure to other viral communicable diseases: Secondary | ICD-10-CM | POA: Diagnosis not present

## 2019-08-10 DIAGNOSIS — Z03818 Encounter for observation for suspected exposure to other biological agents ruled out: Secondary | ICD-10-CM | POA: Diagnosis not present

## 2019-08-10 DIAGNOSIS — Z20828 Contact with and (suspected) exposure to other viral communicable diseases: Secondary | ICD-10-CM | POA: Diagnosis not present

## 2020-06-02 DIAGNOSIS — F4323 Adjustment disorder with mixed anxiety and depressed mood: Secondary | ICD-10-CM | POA: Diagnosis not present

## 2020-09-01 DIAGNOSIS — Z0000 Encounter for general adult medical examination without abnormal findings: Secondary | ICD-10-CM | POA: Diagnosis not present

## 2020-09-01 DIAGNOSIS — Z125 Encounter for screening for malignant neoplasm of prostate: Secondary | ICD-10-CM | POA: Diagnosis not present

## 2020-09-01 DIAGNOSIS — Z8639 Personal history of other endocrine, nutritional and metabolic disease: Secondary | ICD-10-CM | POA: Diagnosis not present

## 2020-09-01 DIAGNOSIS — R7309 Other abnormal glucose: Secondary | ICD-10-CM | POA: Diagnosis not present

## 2020-09-01 DIAGNOSIS — Z13220 Encounter for screening for lipoid disorders: Secondary | ICD-10-CM | POA: Diagnosis not present

## 2021-10-19 DIAGNOSIS — Z0000 Encounter for general adult medical examination without abnormal findings: Secondary | ICD-10-CM | POA: Diagnosis not present

## 2021-10-19 DIAGNOSIS — Z13220 Encounter for screening for lipoid disorders: Secondary | ICD-10-CM | POA: Diagnosis not present

## 2021-10-19 DIAGNOSIS — Z125 Encounter for screening for malignant neoplasm of prostate: Secondary | ICD-10-CM | POA: Diagnosis not present

## 2022-04-30 DIAGNOSIS — J069 Acute upper respiratory infection, unspecified: Secondary | ICD-10-CM | POA: Diagnosis not present

## 2022-11-13 DIAGNOSIS — Z13220 Encounter for screening for lipoid disorders: Secondary | ICD-10-CM | POA: Diagnosis not present

## 2022-11-13 DIAGNOSIS — Z0000 Encounter for general adult medical examination without abnormal findings: Secondary | ICD-10-CM | POA: Diagnosis not present

## 2022-11-13 DIAGNOSIS — R739 Hyperglycemia, unspecified: Secondary | ICD-10-CM | POA: Diagnosis not present

## 2023-05-20 DIAGNOSIS — H6121 Impacted cerumen, right ear: Secondary | ICD-10-CM | POA: Diagnosis not present

## 2023-05-20 DIAGNOSIS — H6592 Unspecified nonsuppurative otitis media, left ear: Secondary | ICD-10-CM | POA: Diagnosis not present

## 2023-11-19 DIAGNOSIS — Z0000 Encounter for general adult medical examination without abnormal findings: Secondary | ICD-10-CM | POA: Diagnosis not present

## 2023-11-19 DIAGNOSIS — E7800 Pure hypercholesterolemia, unspecified: Secondary | ICD-10-CM | POA: Diagnosis not present
